# Patient Record
Sex: Female | Born: 1960 | Race: White | Hispanic: No | Marital: Married | Smoking: Never smoker
Health system: Southern US, Community
[De-identification: ages and names within clinical notes are randomized; demographics above are authoritative.]

## PROBLEM LIST (undated history)

## (undated) DIAGNOSIS — F32A Depression, unspecified: Secondary | ICD-10-CM

## (undated) DIAGNOSIS — F329 Major depressive disorder, single episode, unspecified: Secondary | ICD-10-CM

## (undated) HISTORY — PX: KNEE ARTHROSCOPY: SUR90

---

## 2014-09-12 ENCOUNTER — Encounter (HOSPITAL_BASED_OUTPATIENT_CLINIC_OR_DEPARTMENT_OTHER): Payer: Self-pay

## 2014-09-12 ENCOUNTER — Emergency Department (HOSPITAL_BASED_OUTPATIENT_CLINIC_OR_DEPARTMENT_OTHER): Payer: BLUE CROSS/BLUE SHIELD

## 2014-09-12 ENCOUNTER — Emergency Department (HOSPITAL_BASED_OUTPATIENT_CLINIC_OR_DEPARTMENT_OTHER)
Admission: EM | Admit: 2014-09-12 | Discharge: 2014-09-12 | Disposition: A | Discharge status: Home / Self Care | Payer: BLUE CROSS/BLUE SHIELD | Attending: Emergency Medicine | Admitting: Emergency Medicine

## 2014-09-12 DIAGNOSIS — R109 Unspecified abdominal pain: Secondary | ICD-10-CM | POA: Diagnosis not present

## 2014-09-12 DIAGNOSIS — Z79899 Other long term (current) drug therapy: Secondary | ICD-10-CM | POA: Diagnosis not present

## 2014-09-12 DIAGNOSIS — R1031 Right lower quadrant pain: Secondary | ICD-10-CM | POA: Insufficient documentation

## 2014-09-12 DIAGNOSIS — F329 Major depressive disorder, single episode, unspecified: Secondary | ICD-10-CM | POA: Insufficient documentation

## 2014-09-12 HISTORY — DX: Depression, unspecified: F32.A

## 2014-09-12 HISTORY — DX: Major depressive disorder, single episode, unspecified: F32.9

## 2014-09-12 LAB — URINE MICROSCOPIC-ADD ON

## 2014-09-12 LAB — URINALYSIS, ROUTINE W REFLEX MICROSCOPIC
Bilirubin Urine: NEGATIVE
Glucose, UA: NEGATIVE mg/dL
Ketones, ur: 15 mg/dL — AB
NITRITE: NEGATIVE
Protein, ur: NEGATIVE mg/dL
Specific Gravity, Urine: 1.031 — ABNORMAL HIGH (ref 1.005–1.030)
Urobilinogen, UA: 0.2 mg/dL (ref 0.0–1.0)
pH: 6 (ref 5.0–8.0)

## 2014-09-12 MED ORDER — KETOROLAC TROMETHAMINE 30 MG/ML IJ SOLN
30.0000 mg | Freq: Once | INTRAMUSCULAR | Status: AC
  Administered 2014-09-12: 30 mg via INTRAVENOUS
  Filled 2014-09-12: qty 1

## 2014-09-12 MED ORDER — OXYCODONE-ACETAMINOPHEN 5-325 MG PO TABS: 1.0000 | ORAL_TABLET | Freq: Four times a day (QID) | ORAL | Status: AC | PRN

## 2014-09-12 MED ORDER — ONDANSETRON HCL 4 MG/2ML IJ SOLN
4.0000 mg | Freq: Once | INTRAMUSCULAR | Status: AC
  Administered 2014-09-12: 4 mg via INTRAVENOUS
  Filled 2014-09-12: qty 2

## 2014-09-12 MED ORDER — MORPHINE SULFATE 4 MG/ML IJ SOLN: 4.0000 mg | Freq: Once | INTRAMUSCULAR | Status: DC

## 2014-09-12 NOTE — ED Notes (Signed)
Pt c/o rt flank pain radiating down to groin since 2145, took ibuprofen with no relief; denies urinary s/s

## 2014-09-12 NOTE — Discharge Instructions (Signed)
Percocet as prescribed as needed for pain.  Return to the emergency department for worsening pain, high fever, or other new and concerning symptoms.   Flank Pain Flank pain refers to pain that is located on the side of the body between the upper abdomen and the back. The pain may occur over a short period of time (acute) or may be long-term or reoccurring (chronic). It may be mild or severe. Flank pain can be caused by many things. CAUSES  Some of the more common causes of flank pain include:  Muscle strains.   Muscle spasms.   A disease of your spine (vertebral disk disease).   A lung infection (pneumonia).   Fluid around your lungs (pulmonary edema).   A kidney infection.   Kidney stones.   A very painful skin rash caused by the chickenpox virus (shingles).   Gallbladder disease.  HOME CARE INSTRUCTIONS  Home care will depend on the cause of your pain. In general,  Rest as directed by your caregiver.  Drink enough fluids to keep your urine clear or pale yellow.  Only take over-the-counter or prescription medicines as directed by your caregiver. Some medicines may help relieve the pain.  Tell your caregiver about any changes in your pain.  Follow up with your caregiver as directed. SEEK IMMEDIATE MEDICAL CARE IF:   Your pain is not controlled with medicine.   You have new or worsening symptoms.  Your pain increases.   You have abdominal pain.   You have shortness of breath.   You have persistent nausea or vomiting.   You have swelling in your abdomen.   You feel faint or pass out.   You have blood in your urine.  You have a fever or persistent symptoms for more than 2-3 days.  You have a fever and your symptoms suddenly get worse. MAKE SURE YOU:   Understand these instructions.  Will watch your condition.  Will get help right away if you are not doing well or get worse. Document Released: 08/12/2005 Document Revised: 03/15/2012  Document Reviewed: 02/03/2012 [facility] Patient Information 2015 [employer], [place]. This information is not intended to replace advice given to you by your health care provider. Make sure you discuss any questions you have with your health care provider.

## 2014-09-12 NOTE — ED Notes (Signed)
[facility], NAD, calm, interactive.

## 2014-09-12 NOTE — ED Provider Notes (Signed)
CSN: [id number]     Arrival date & time 09/12/14  0041 History   First MD Initiated Contact with Patient 09/12/14 [phone]     Chief Complaint  Patient presents with  . Flank Pain     (Consider location/radiation/quality/duration/timing/severity/associated sxs/prior Treatment) HPI Comments: Patient is a 54 year old female with no significant past medical history. She presents for evaluation of right flank and right lower abdominal pain that started at approximately 10:00 this evening. This began in the absence of any injury or trauma. She denies any radiation into her legs. She denies any urinary complaints, bowel complaints, fevers, or chills.  Patient is a 54 y.o. female presenting with flank pain. The history is provided by the patient.  Flank Pain This is a new problem. The current episode started 3 to 5 hours ago. The problem occurs constantly. The problem has been rapidly worsening. Associated symptoms include abdominal pain. Nothing aggravates the symptoms. Nothing relieves the symptoms. Treatments tried: Ibuprofen. The treatment provided no relief.    Past Medical History  Diagnosis Date  . Depression    Past Surgical History  Procedure Laterality Date  . Knee arthroscopy     No family history on file. History  Substance Use Topics  . Smoking status: Never Smoker   . Smokeless tobacco: Not on file  . Alcohol Use: Yes   OB History    No data available     Review of Systems  Gastrointestinal: Positive for abdominal pain.  Genitourinary: Positive for flank pain.  All other systems reviewed and are negative.     Allergies  Review of patient's allergies indicates no known allergies.  Home Medications   Prior to Admission medications   Medication Sig Start Date End Date Taking? Authorizing Provider  FLUoxetine (PROZAC) 40 MG capsule Take 40 mg by mouth daily.   Yes Historical Provider, MD   BP 134/86 mmHg  Pulse 106  Temp(Src) 97.5 F (36.4 C) (Oral)  Resp 20   Ht 5\' 8"  (1.727 m)  Wt 183 lb (83.008 kg)  BMI 27.83 kg/m2  SpO2 100% Physical Exam  Constitutional: She is oriented to person, place, and time. She appears well-developed and well-nourished. No distress.  HENT:  Head: Normocephalic and atraumatic.  Neck: Normal range of motion. Neck supple.  Cardiovascular: Normal rate and regular rhythm.  Exam reveals no gallop and no friction rub.   No murmur heard. Pulmonary/Chest: Effort normal and breath sounds normal. No respiratory distress. She has no wheezes.  Abdominal: Soft. Bowel sounds are normal. She exhibits no distension. There is tenderness. There is no rebound and no guarding.  There is tenderness to palpation in the right flank and right lower abdomen. There is no rebound and no guarding.  Musculoskeletal: Normal range of motion.  Neurological: She is alert and oriented to person, place, and time.  Skin: Skin is warm and dry. She is not diaphoretic.  Nursing note and vitals reviewed.   ED Course  Procedures (including critical care time) Labs Review Labs Reviewed  URINALYSIS, ROUTINE W REFLEX MICROSCOPIC - Abnormal; Notable for the following:    Specific Gravity, Urine 1.031 (*)    Hgb urine dipstick TRACE (*)    Ketones, ur 15 (*)    Leukocytes, UA MODERATE (*)    All other components within normal limits  URINE MICROSCOPIC-ADD ON - Abnormal; Notable for the following:    Bacteria, UA FEW (*)    All other components within normal limits    Imaging Review No  results found.   EKG Interpretation None      MDM   Final diagnoses:  None    Patient presents with right flank pain. Her workup reveals no evidence for renal calculus and her physical examination is most consistent with a musculoskeletal etiology. She is given Toradol without significant relief. She will be discharged to home with Percocet, NSAIDs, and when necessary return.     Geoffery Lyons, MD 09/12/14 [phone]

## 2016-07-03 IMAGING — CT CT RENAL STONE PROTOCOL
2 of 4 series · 16 of 46 positions shown, 18 images · non-contrast
Comparison: None.

CLINICAL DATA: Right flank pain radiating to the groin for 6 hours.
Question renal stone.

EXAM:
CT ABDOMEN AND PELVIS WITHOUT CONTRAST
TECHNIQUE: Multidetector CT imaging of the abdomen and pelvis was performed
following the standard protocol without IV contrast.

[Series 2: renal stone > 200 lbs 5.0 b31f · axial · 0.86mm/px · z∈[-644,-244]mm · 13 of 89 slices shown, 15 images]
[im 5/89  soft-tissue]
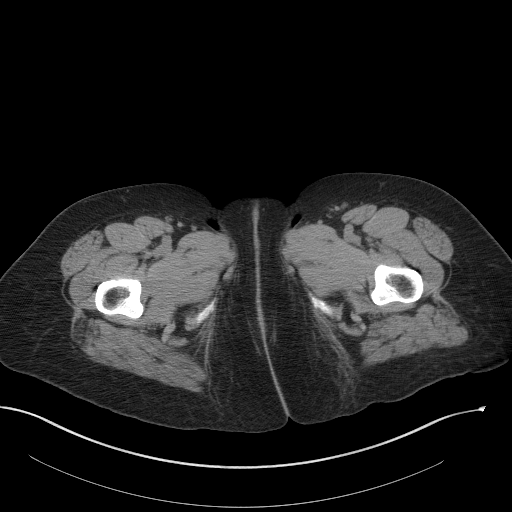
[im 5/89  bone]
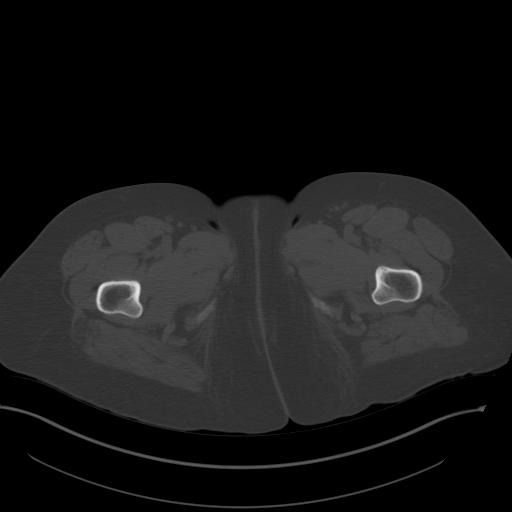
[im 13/89  soft-tissue]
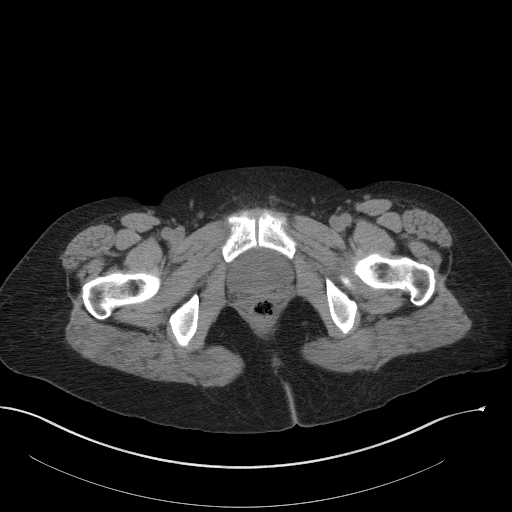
[im 21/89  soft-tissue]
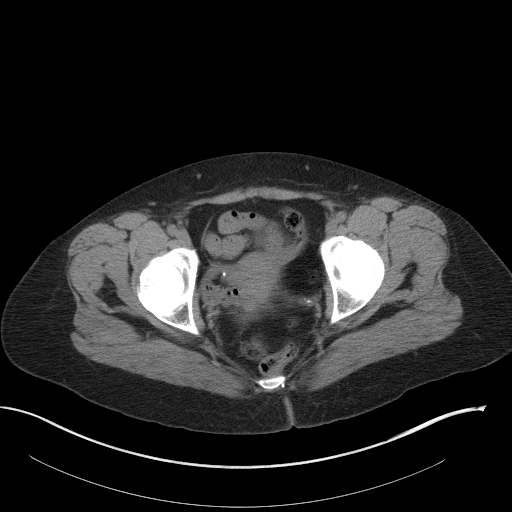
[im 25/89  soft-tissue]
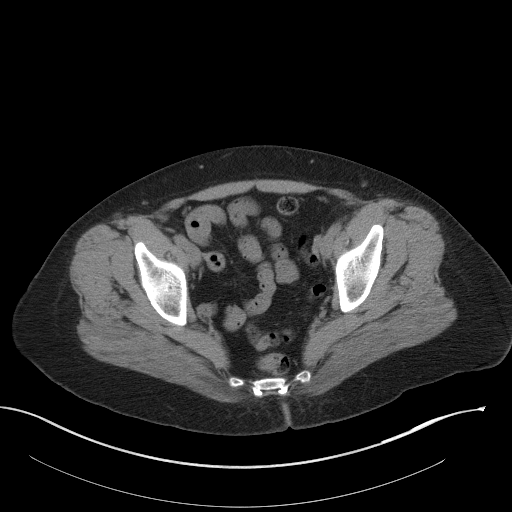
[im 33/89  soft-tissue]
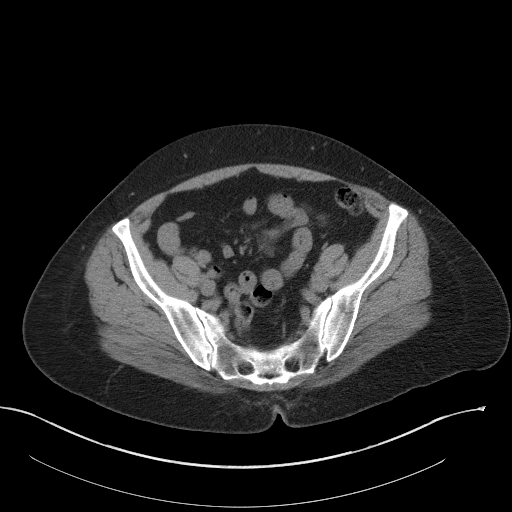
[im 37/89  soft-tissue]
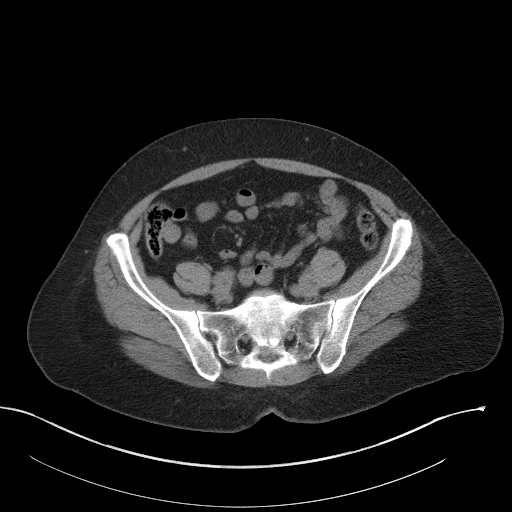
[im 45/89  soft-tissue]
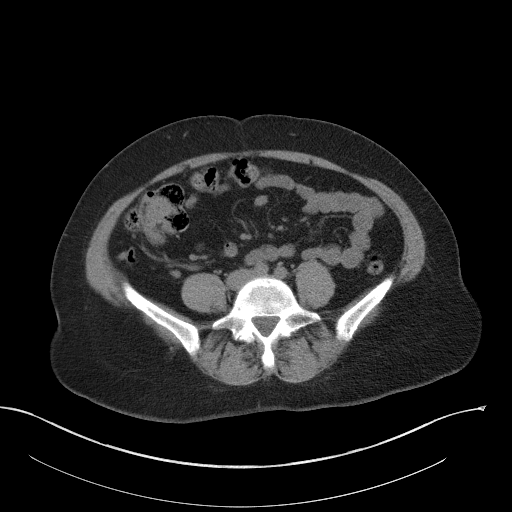
[im 53/89  soft-tissue]
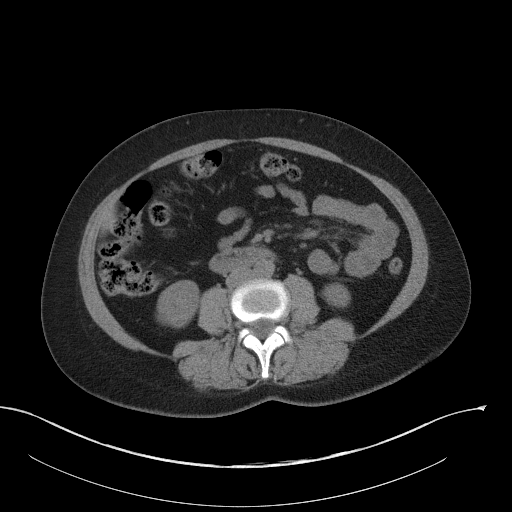
[im 57/89  soft-tissue]
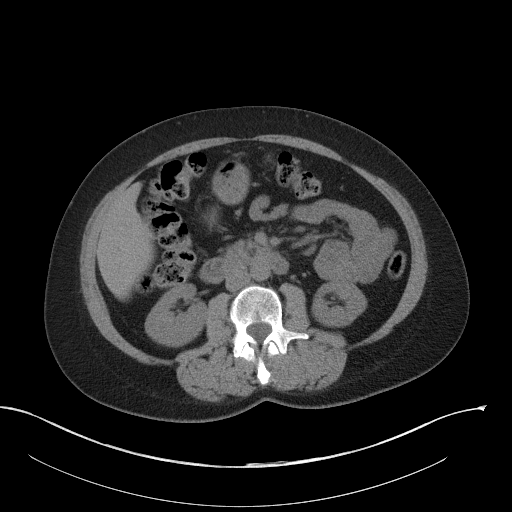
[im 57/89  bone]
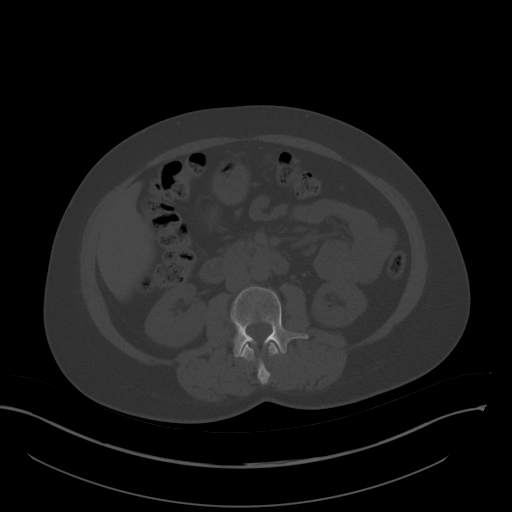
[im 65/89  soft-tissue]
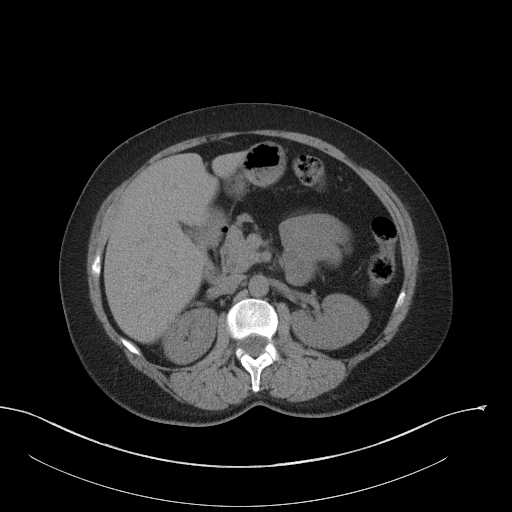
[im 69/89  soft-tissue]
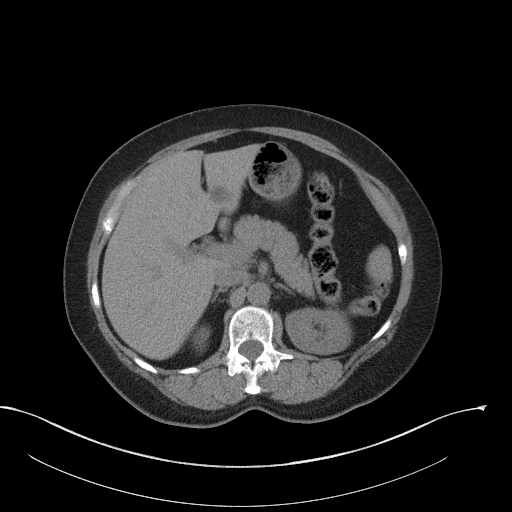
[im 77/89  soft-tissue]
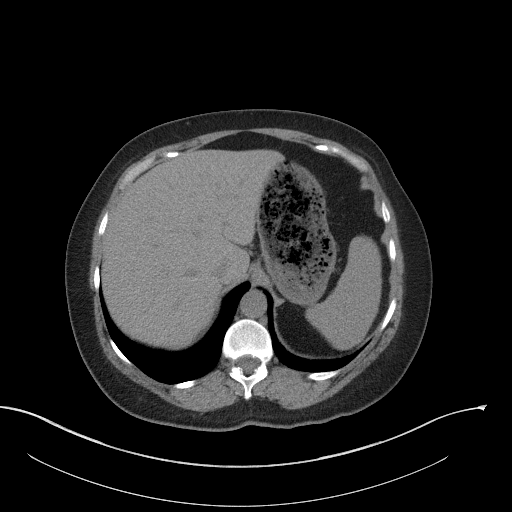
[im 85/89  soft-tissue]
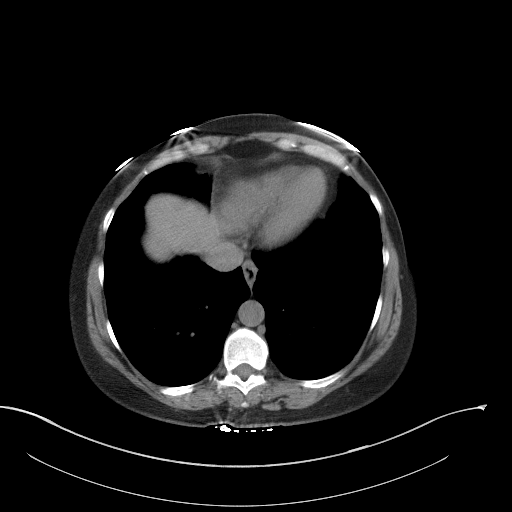

[Series 5: renal stone 3.0 coronal · coronal · 0.79mm/px · 3 of 92 slices shown]
[im 31/92  soft-tissue]
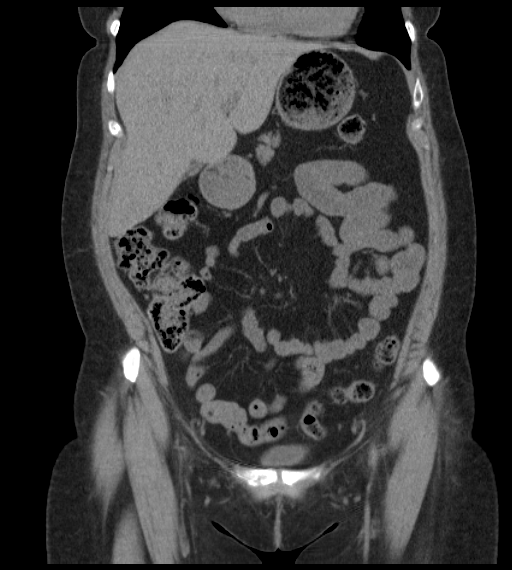
[im 41/92  soft-tissue]
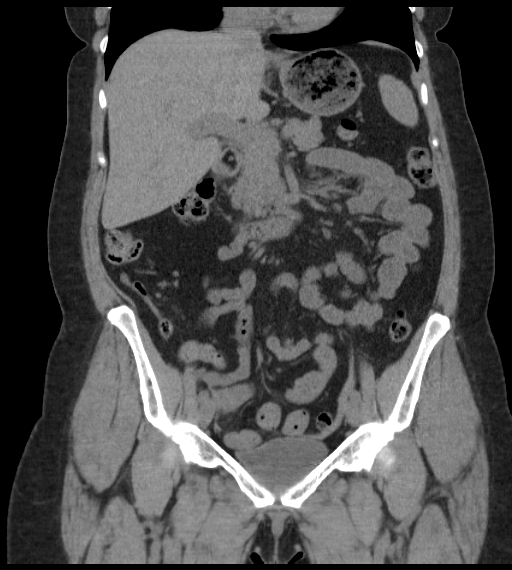
[im 51/92  soft-tissue]
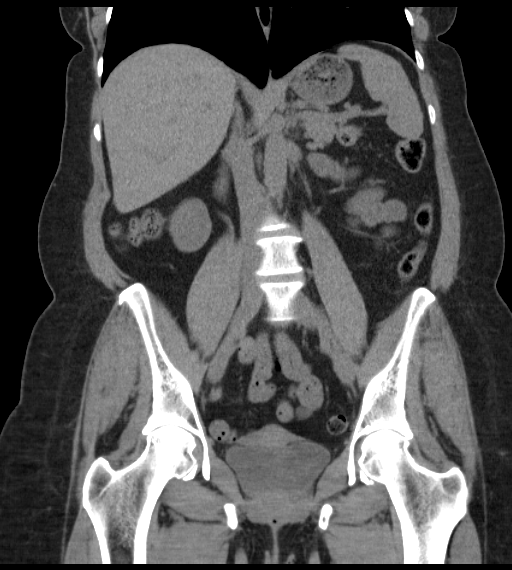

[16 of 46 positions shown; findings below may reference images not displayed]

FINDINGS: The included lung bases are clear. Mild thickening of the distal
esophagus.

The kidneys are symmetric in size without stones or hydronephrosis.
There is no perinephric stranding. Both ureters are decompressed
without stones along the course.

There is a 3.3 x 3.5 x 2.2 cm cyst in the left lobe of the liver.
The unenhanced liver is otherwise unremarkable. The gallbladder is
decompressed. The spleen is normal in size. Peripherally calcified
17 mm structure at the splenic hilum, likely calcified splenic
artery aneurysm. The adrenal glands are normal.

There is no peripancreatic inflammatory change or pancreatic ductal
dilatation. Elongated fat density at the pancreatic head neck
junction measuring 2.2 x 0.8 cm may reflect invagination adjacent
mesenteric fat versus pancreatic lipoma.

The stomach is physiologically distended with ingested contents.
There are no dilated or thickened bowel loops. The appendix is
normal. Small volume of colonic stool. No free air, free fluid, or
intra-abdominal fluid collection.

The abdominal aorta is normal in caliber. No retroperitoneal
adenopathy. Tiny fat containing umbilical hernia.

Within the pelvis the urinary bladder is minimally distended without
stone. The uterus and adnexa are normal for age. No inguinal hernia.

No acute or suspicious osseous abnormality.
IMPRESSION: 1.  No renal stones or obstructive uropathy.
2. No acute abnormality in the abdomen/pelvis.
3. Incidental findings of hepatic cyst. Question pancreatic lipoma
versus invagination adjacent mesenteric fat, of no clinical
significance.
4. Peripherally calcified 17 mm rounded density at the splenic hilum
is likely splenic artery aneurysm, less likely granuloma.
